# Patient Record
Sex: Female | Born: 2000 | Race: White | Hispanic: No | Marital: Single | Smoking: Current some day smoker
Health system: Southern US, Community
[De-identification: ages and names within clinical notes are randomized; demographics above are authoritative.]

## PROBLEM LIST (undated history)

## (undated) DIAGNOSIS — F32A Depression, unspecified: Secondary | ICD-10-CM

## (undated) DIAGNOSIS — Z9109 Other allergy status, other than to drugs and biological substances: Secondary | ICD-10-CM

## (undated) HISTORY — DX: Depression, unspecified: F32.A

---
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
## 2023-11-14 ENCOUNTER — Encounter: Payer: Self-pay | Admitting: Internal Medicine

## 2023-11-14 ENCOUNTER — Encounter: Payer: Self-pay | Admitting: *Deleted

## 2023-11-14 ENCOUNTER — Ambulatory Visit: Attending: Internal Medicine | Admitting: Internal Medicine

## 2023-11-14 ENCOUNTER — Encounter: Payer: Self-pay | Admitting: General Practice

## 2023-11-14 VITALS — BP 124/86 | HR 62 | Ht 65.0 in | Wt 151.8 lb

## 2023-11-14 DIAGNOSIS — R002 Palpitations: Secondary | ICD-10-CM | POA: Insufficient documentation

## 2023-11-14 DIAGNOSIS — R079 Chest pain, unspecified: Secondary | ICD-10-CM | POA: Insufficient documentation

## 2023-11-14 MED ORDER — PROPRANOLOL HCL 10 MG PO TABS
10.0000 mg | ORAL_TABLET | Freq: Three times a day (TID) | ORAL | 2 refills | Status: AC
Start: 2023-11-14 — End: ?

## 2023-11-14 NOTE — Progress Notes (Signed)
 Updated medication, medical and social hx

## 2023-11-14 NOTE — Patient Instructions (Addendum)
 Medication Instructions:  Your physician has recommended you make the following change in your medication:  Stop taking Atenolol  Start Propanolol 10 mg three times daily (after 4-6 weeks if no improvement of palpitations with [facility] protocol for POTS)  When you come back to your 6 month follow up will need to hold the Propanolol 2 days before office visit  Continue taking all other medications as prescribed   Labwork: None  Testing/Procedures: None  Follow-Up: Your physician recommends that you schedule a follow-up appointment in: 6 months  Any Other Special Instructions Will Be Listed Below (If Applicable).  Have requested monitor reported from Primary Care Physician  Thank you for choosing [facility]!     If you need a refill on your cardiac medications before your next appointment, please call your pharmacy.

## 2023-11-14 NOTE — Progress Notes (Signed)
 Cardiology Office Note  Date: 11/14/2023   ID: Vanessa Morrison, DOB Apr 22, 2000, MRN [id number]  PCP:  Rosamond Leta NOVAK, MD  Cardiologist:  Diannah SHAUNNA Maywood, MD Electrophysiologist:  None   History of Present Illness: Vanessa Morrison is a 23 y.o. female  Referred to cardiology clinic for evaluation of palpitations and chest pain.  Patient's PCP is worried she might have POTS.  No objective evidence yet.  Currently on atenolol.  Orthostatics today were negative for orthostatic hypotension and POTS.  Palpitations improved after starting atenolol.  She still continues to have some chest pains and SOB when she gets palpitations.  She reports some palpitations especially in standing position that improves in the sitting and lying down position.  Does not have any other symptoms of syncope, leg swelling, presyncope.  Past Medical History:  Diagnosis Date   Depression    Environmental allergies     No past surgical history on file.  Current Outpatient Medications  Medication Sig Dispense Refill   busPIRone (BUSPAR) 10 MG tablet Take 10 mg by mouth 2 (two) times daily.     lumateperone tosylate (CAPLYTA) 42 MG capsule Take 42 mg by mouth daily.     propranolol  (INDERAL ) 10 MG tablet Take 1 tablet (10 mg total) by mouth 3 (three) times daily. 90 tablet 2   ibuprofen  (ADVIL ,MOTRIN ) 600 MG tablet Take 1 tablet (600 mg total) by mouth every 8 (eight) hours as needed for moderate pain. (Patient not taking: Reported on 11/14/2023) 30 tablet 0   No current facility-administered medications for this visit.   Allergies:  Bactroban [mupirocin calcium]   Social History: The patient  reports that she has been smoking cigarettes. She has been exposed to tobacco smoke. She has never used smokeless tobacco. She reports that she does not drink alcohol and does not use drugs.   Family History: The patient's family history is not on file.   ROS:  Please see the history of present illness.  Otherwise, complete review of systems is positive for none  All other systems are reviewed and negative.   Physical Exam: VS:  BP 124/86 (BP Location: Left Arm, Patient Position: Sitting, Cuff Size: Normal)   Pulse 62   Ht 5' 5 (1.651 m)   Wt 151 lb 12.8 oz (68.9 kg)   SpO2 99%   BMI 25.26 kg/m , BMI Body mass index is 25.26 kg/m.  Wt Readings from Last 3 Encounters:  11/14/23 151 lb 12.8 oz (68.9 kg)  12/01/13 159 lb (72.1 kg) (98%, Z= 2.03)*  06/14/13 153 lb (69.4 kg) (98%, Z= 2.05)*   * Growth percentiles are based on CDC (Girls, 2-20 Years) data.    General: Patient appears comfortable at rest. HEENT: Conjunctiva and lids normal, oropharynx clear with moist mucosa. Neck: Supple, no elevated JVP or carotid bruits, no thyromegaly. Lungs: Clear to auscultation, nonlabored breathing at rest. Cardiac: Regular rate and rhythm, no S3 or significant systolic murmur, no pericardial rub. Abdomen: Soft, nontender, no hepatomegaly, bowel sounds present, no guarding or rebound. Extremities: No pitting edema, distal pulses 2+. Skin: Warm and dry. Musculoskeletal: No kyphosis. Neuropsychiatric: Alert and oriented x3, affect grossly appropriate.  Recent Labwork: No results found for requested labs within last 365 days.  No results found for: CHOL, TRIG, HDL, CHOLHDL, VLDL, LDLCALC, LDLDIRECT  Other Studies Reviewed Today:   Assessment and Plan:   Palpitations and chest pain: Patient reports having palpitations in the standing position that improves in a sitting  and lying down position.  She also gets chest pains with palpitations.  She has underlying diagnosis of anxiety for which she is on antianxiety medications.  Palpitations improved with atenolol.  Discussed symptoms and management of POTS with the patient and her boyfriend today.  Orthostatics today were negative for POTS or orthostatic hypotension.  If symptoms not improve with structured exercise regimen for POTS,  she will start taking propranolol  10 mg 3 times daily after 4 to 6 weeks.  Will discontinue atenolol today.  30 minutes spent in reviewing prior records including labs, discussion of her medical condition and documentation.  Medication Adjustments/Labs and Tests Ordered: Current medicines are reviewed at length with the patient today.  Concerns regarding medicines are outlined above.    Disposition:  Follow up 6 months  Signed Raffaela Ladley Priya Kamen Hanken, MD, 11/14/2023 4:42 PM    [facility] [address] [place], [place], [place] [address]

## 2023-11-20 ENCOUNTER — Encounter: Payer: Self-pay | Admitting: Internal Medicine

## 2023-11-21 ENCOUNTER — Encounter: Payer: Self-pay | Admitting: Internal Medicine
